# Patient Record
Sex: Male | Born: 2000 | Race: Black or African American | Hispanic: No | Marital: Single | Smoking: Never smoker
Health system: Southern US, Community
[De-identification: ages and names within clinical notes are randomized; demographics above are authoritative.]

## PROBLEM LIST (undated history)

## (undated) DIAGNOSIS — J45909 Unspecified asthma, uncomplicated: Secondary | ICD-10-CM

---
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
## 2022-02-22 ENCOUNTER — Ambulatory Visit (HOSPITAL_COMMUNITY)
Admission: EM | Admit: 2022-02-22 | Discharge: 2022-02-22 | Disposition: A | Discharge status: Home / Self Care | Payer: Medicaid Other | Attending: Family Medicine | Admitting: Family Medicine

## 2022-02-22 ENCOUNTER — Other Ambulatory Visit: Payer: Self-pay

## 2022-02-22 ENCOUNTER — Encounter (HOSPITAL_COMMUNITY): Payer: Self-pay | Admitting: Emergency Medicine

## 2022-02-22 DIAGNOSIS — Z20822 Contact with and (suspected) exposure to covid-19: Secondary | ICD-10-CM | POA: Insufficient documentation

## 2022-02-22 DIAGNOSIS — R059 Cough, unspecified: Secondary | ICD-10-CM | POA: Diagnosis present

## 2022-02-22 DIAGNOSIS — R21 Rash and other nonspecific skin eruption: Secondary | ICD-10-CM | POA: Diagnosis present

## 2022-02-22 DIAGNOSIS — J069 Acute upper respiratory infection, unspecified: Secondary | ICD-10-CM | POA: Diagnosis not present

## 2022-02-22 MED ORDER — VALACYCLOVIR HCL 1 G PO TABS: 2000.0000 mg | ORAL_TABLET | Freq: Two times a day (BID) | ORAL | 0 refills | Status: AC

## 2022-02-22 NOTE — ED Triage Notes (Signed)
Patient reports rash just below lower lip, left of center.  Reports they itch. Patient first noticed area yesterday night.   Not seeing rash as patient describes ?

## 2022-02-22 NOTE — Discharge Instructions (Addendum)
Take valacyclovir 1000 mg--2 tabs tonight and 2 tabs tomorrow, and then you will be done with that medicine ? ? ?You have been swabbed for COVID, and the test will result in the next 24 hours. Our staff will call you if positive. If the test is positive, you should quarantine for 5 days.  ?

## 2022-02-22 NOTE — ED Provider Notes (Signed)
[place] border of his central lower lip that began yesterday. ?[facility] ? ? ? ?CSN: [id number] ?Arrival date & time: 02/22/22  1858 ? ? ?  ? ?History   ?Chief Complaint ?Chief Complaint  ?Patient presents with  ? Rash  ? ? ?HPI ?Ryan Novak is a 21 y.o. male.  ? ? ?Rash ?Here for a little bit of pruritic and painful rash he has also had a little bit of cough and nasal congestion.  No fever and no vomiting and no sore throat ? ?Past Medical History:  ?Diagnosis Date  ? Asthma   ? ? ?Patient Active Problem List  ? Diagnosis Date Noted  ? 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 Suicide attempt by drug overdose (HCC) 04/13/2021  ? Unresolved grief 04/13/2021  ? Generalized anxiety disorder 03/05/2021  ? Oppositional defiant disorder 11/13/2020  ? ? ?History reviewed. No pertinent surgical history. ? ? ? ? ?Home Medications   ? ?Prior to Admission medications   ?Medication Sig Start Date End Date Taking? Authorizing Provider  ?valACYclovir (VALTREX) 1000 MG tablet Take 2 tablets (2,000 mg total) by mouth 2 (two) times daily for 2 doses. 02/22/22 02/23/22 Yes Zenia Resides, MD  ?escitalopram (LEXAPRO) 10 MG tablet Take 1 tablet (10 mg total) by mouth daily. ?Patient not taking: Reported on 02/22/2022 04/18/21   Leata Mouse, MD  ?hydrOXYzine (ATARAX/VISTARIL) 25 MG tablet Take 1 tablet (25 mg total) by mouth at bedtime as needed for anxiety. ?Patient not taking: Reported on 02/22/2022 04/17/21   Leata Mouse, MD  ? ? ?Family History ?Family History  ?Problem Relation Age of Onset  ? Healthy Mother   ? Healthy Father   ? ? ?Social History ?Social History  ? ?Tobacco Use  ? Smoking status: Never  ? Smokeless tobacco: Never  ?Vaping Use  ? Vaping Use: Never used  ?Substance Use Topics  ? Alcohol use: Never  ? Drug use: Never  ? ? ? ?Allergies   ?Patient has no known allergies. ? ? ?Review of Systems ?Review of Systems  ?Skin:  Positive  for rash.  ? ? ?Physical Exam ?Triage Vital Signs ?ED Triage Vitals  ?Enc Vitals Group  ?   BP 02/22/22 2016 125/63  ?   Pulse Rate 02/22/22 2016 70  ?   Resp 02/22/22 2016 16  ?   Temp 02/22/22 2016 98.5 ?F (36.9 ?C)  ?   Temp Source 02/22/22 2016 Oral  ?   SpO2 02/22/22 2016 97 %  ?   Weight --   ?   Height --   ?   Head Circumference --   ?   Peak Flow --   ?   Pain Score 02/22/22 2015 0  ?   Pain Loc --   ?   Pain Edu? --   ?   Excl. in GC? --   ? ?No data found. ? ?Updated Vital Signs ?BP 125/63 (BP Location: Left Arm)   Pulse 70   Temp 98.5 ?F (36.9 ?C) (Oral)   Resp 16   SpO2 97%  ? ?Visual Acuity ?Right Eye Distance:   ?Left Eye Distance:   ?Bilateral Distance:   ? ?Right Eye Near:   ?Left Eye Near:    ?Bilateral Near:    ? ?Physical Exam ?Vitals reviewed.  ?Constitutional:   ?   General: He is not in acute distress. ?   Appearance: He is not toxic-appearing.  ?HENT:  ?  Right Ear: Tympanic membrane and ear canal normal.  ?   Left Ear: Tympanic membrane and ear canal normal.  ?   Nose: Nose normal.  ?   Mouth/Throat:  ?   Mouth: Mucous membranes are moist.  ?   Pharynx: No oropharyngeal exudate or posterior oropharyngeal erythema.  ?Eyes:  ?   Extraocular Movements: Extraocular movements intact.  ?   Conjunctiva/sclera: Conjunctivae normal.  ?   Pupils: Pupils are equal, round, and reactive to light.  ?Cardiovascular:  ?   Rate and Rhythm: Normal rate and regular rhythm.  ?   Heart sounds: No murmur heard. ?Pulmonary:  ?   Effort: Pulmonary effort is normal.  ?   Breath sounds: Normal breath sounds.  ?Musculoskeletal:  ?   Cervical back: Neck supple.  ?Lymphadenopathy:  ?   Cervical: No cervical adenopathy.  ?Skin: ?   Capillary Refill: Capillary refill takes less than 2 seconds.  ?   Coloration: Skin is not jaundiced or pale.  ?   Findings: Rash (There is a small group of very small vesicles on the central vermilion border of his lower lip; no and duration and no drainage) present.  ?Neurological:  ?    General: No focal deficit present.  ?   Mental Status: He is alert and oriented to person, place, and time.  ?Psychiatric:     ?   Behavior: Behavior normal.  ? ? ? ?UC Treatments / Results  ?Labs ?(all labs ordered are listed, but only abnormal results are displayed) ?Labs Reviewed  ?SARS CORONAVIRUS 2 (TAT 6-24 HRS)  ? ? ?EKG ? ? ?Radiology ?No results found. ? ?Procedures ?Procedures (including critical care time) ? ?Medications Ordered in UC ?Medications - No data to display ? ?Initial Impression / Assessment and Plan / UC Course  ?I have reviewed the triage vital signs and the nursing notes. ? ?Pertinent labs & imaging results that were available during my care of the patient were reviewed by me and considered in my medical decision making (see chart for details). ? ?  ? 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  Swab done for COVID so he knows if he needs to quarantine ?Final Clinical Impressions(s) / UC Diagnoses  ? ?Final diagnoses:  ?Rash  ?Acute upper respiratory infection  ? ? ? ?Discharge Instructions   ? ?  ?Take valacyclovir 1000 mg--2 tabs tonight and 2 tabs tomorrow, and then you will be done with that medicine ? ? ?You have been swabbed for COVID, and the test will result in the next 24 hours. Our staff will call you if positive. If the test is positive, you should quarantine for 5 days.  ? ? ? ? ?ED Prescriptions   ? ? Medication Sig Dispense Auth. Provider  ? valACYclovir (VALTREX) 1000 MG tablet Take 2 tablets (2,000 mg total) by mouth 2 (two) times daily for 2 doses. 4 tablet Marlinda Mike Janace Aris, MD  ? ?  ? ?PDMP not reviewed this encounter. ?  ?Zenia Resides, MD ?02/22/22 2034 ? ?

## 2022-02-23 LAB — SARS CORONAVIRUS 2 (TAT 6-24 HRS): SARS Coronavirus 2: NEGATIVE

## 2022-03-24 ENCOUNTER — Ambulatory Visit (HOSPITAL_COMMUNITY)
Admission: EM | Admit: 2022-03-24 | Discharge: 2022-03-24 | Disposition: A | Discharge status: Home / Self Care | Payer: Medicaid Other | Attending: Nurse Practitioner | Admitting: Nurse Practitioner

## 2022-03-24 ENCOUNTER — Encounter (HOSPITAL_COMMUNITY): Payer: Self-pay | Admitting: Emergency Medicine

## 2022-03-24 DIAGNOSIS — H1033 Unspecified acute conjunctivitis, bilateral: Secondary | ICD-10-CM | POA: Diagnosis not present

## 2022-03-24 MED ORDER — TOBRAMYCIN-DEXAMETHASONE 0.3-0.1 % OP SUSP: 2.0000 [drp] | OPHTHALMIC | 0 refills | Status: AC

## 2022-03-24 NOTE — ED Provider Notes (Signed)
?[facility] ? ? ? ?CSN: [id number] ?Arrival date & time: 03/24/22  1834 ? ? ?  ? ?History   ?Chief Complaint ?Chief Complaint  ?Patient presents with  ? Eye Drainage  ? Cough  ? Nasal Congestion  ? ? ?HPI ?Ryan Novak is a 21 y.o. male.  ? ?The patient is a 21 year old male who presents with bilateral eye redness.  Patient states he started with upper respiratory symptoms over 1 week ago.  He states over the past 4 days, he developed redness in the right eye.  He states the next day when he woke up he had redness in the left eye.  He complains of redness, erythema, drainage, swelling, and headache.  He does not wear glasses or contacts at this time.  He states that the upper respiratory symptoms have since improved.  He endorses continuous drainage throughout the day.  Drainage is clear to yellow.  He does work at [employer] and [employer] money.  He does not have any other sick contacts.  He has not tried anything for his symptoms. ? ?The history is provided by the patient.  ? ?Past Medical History:  ?Diagnosis Date  ? Asthma   ? ? ?Patient Active Problem List  ? Diagnosis Date Noted  ? 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 Suicide attempt by drug overdose (HCC) 04/13/2021  ? Unresolved grief 04/13/2021  ? Generalized anxiety disorder 03/05/2021  ? Oppositional defiant disorder 11/13/2020  ? ? ?History reviewed. No pertinent surgical history. ? ? ? ? ?Home Medications   ? ?Prior to Admission medications   ?Medication Sig Start Date End Date Taking? Authorizing Provider  ?escitalopram (LEXAPRO) 10 MG tablet Take 1 tablet (10 mg total) by mouth daily. ?Patient not taking: Reported on 02/22/2022 04/18/21   Leata MouseJonnalagadda, Janardhana, MD  ?hydrOXYzine (ATARAX/VISTARIL) 25 MG tablet Take 1 tablet (25 mg total) by mouth at bedtime as needed for anxiety. ?Patient not taking: Reported on 02/22/2022 04/17/21   Leata MouseJonnalagadda, Janardhana, MD  ? ? ?Family  History ?Family History  ?Problem Relation Age of Onset  ? Healthy Mother   ? Healthy Father   ? ? ?Social History ?Social History  ? ?Tobacco Use  ? Smoking status: Never  ? Smokeless tobacco: Never  ?Vaping Use  ? Vaping Use: Never used  ?Substance Use Topics  ? Alcohol use: Never  ? Drug use: Never  ? ? ? ?Allergies   ?Patient has no known allergies. ? ? ?Review of Systems ?Review of Systems  ?Constitutional: Negative.   ?HENT:  Negative for congestion, postnasal drip, rhinorrhea and sore throat.   ?Eyes:  Positive for pain, discharge, redness and itching. Negative for photophobia and visual disturbance.  ?Respiratory: Negative.    ?Cardiovascular: Negative.   ?Gastrointestinal: Negative.   ?Skin: Negative.   ?Psychiatric/Behavioral: Negative.    ? ? ?Physical Exam ?Triage Vital Signs ?ED Triage Vitals  ?Enc Vitals Group  ?   BP 03/24/22 1852 (!) 144/90  ?   Pulse Rate 03/24/22 1852 97  ?   Resp 03/24/22 1852 18  ?   Temp 03/24/22 1852 99.1 ?F (37.3 ?C)  ?   Temp Source 03/24/22 1852 Oral  ?   SpO2 03/24/22 1852 97 %  ?   Weight --   ?   Height --   ?   Head Circumference --   ?   Peak Flow --   ?   Pain Score 03/24/22 1850 7  ?  Pain Loc --   ?   Pain Edu? --   ?   Excl. in GC? --   ? ?No data found. ? ?Updated Vital Signs ?BP (!) 144/90   Pulse 97   Temp 99.1 ?F (37.3 ?C) (Oral)   Resp 18   SpO2 97%  ? ?Visual Acuity ?Right Eye Distance:   ?Left Eye Distance:   ?Bilateral Distance:   ? ?Right Eye Near:   ?Left Eye Near:    ?Bilateral Near:    ? ?Physical Exam ?Vitals reviewed.  ?Constitutional:   ?   Appearance: Normal appearance.  ?HENT:  ?   Head: Normocephalic and atraumatic.  ?   Right Ear: Tympanic membrane, ear canal and external ear normal.  ?   Left Ear: Tympanic membrane, ear canal and external ear normal.  ?   Nose: Nose normal.  ?   Mouth/Throat:  ?   Mouth: Mucous membranes are moist.  ?Eyes:  ?   General: Lids are normal. Vision grossly intact. Gaze aligned appropriately. Scleral icterus  present. No visual field deficit.    ?   Right eye: Discharge present. No foreign body or hordeolum.     ?   Left eye: Discharge present.No foreign body or hordeolum.  ?   Extraocular Movements: Extraocular movements intact.  ?   Right eye: Normal extraocular motion and no nystagmus.  ?   Left eye: Normal extraocular motion and no nystagmus.  ?   Conjunctiva/sclera:  ?   Right eye: Right conjunctiva is injected. Exudate present. No chemosis or hemorrhage. ?   Left eye: Left conjunctiva is injected. Exudate present. No chemosis or hemorrhage. ?   Pupils: Pupils are equal, round, and reactive to light.  ?Cardiovascular:  ?   Rate and Rhythm: Normal rate and regular rhythm.  ?   Pulses: Normal pulses.  ?   Heart sounds: Normal heart sounds.  ?Pulmonary:  ?   Effort: Pulmonary effort is normal.  ?   Breath sounds: Normal breath sounds.  ?Abdominal:  ?   General: Bowel sounds are normal.  ?   Palpations: Abdomen is soft.  ?   Tenderness: There is no abdominal tenderness.  ?Musculoskeletal:  ?   Cervical back: Normal range of motion and neck supple.  ?Skin: ?   General: Skin is warm and dry.  ?   Capillary Refill: Capillary refill takes less than 2 seconds.  ?Neurological:  ?   General: No focal deficit present.  ?   Mental Status: He is alert and oriented to person, place, and time.  ?Psychiatric:     ?   Mood and Affect: Mood normal.     ?   Behavior: Behavior normal.  ? ? ? ?UC Treatments / Results  ?Labs ?(all labs ordered are listed, but only abnormal results are displayed) ?Labs Reviewed - No data to display ? ?EKG ? ? ?Radiology ?No results found. ? ?Procedures ?Procedures (including critical care time) ? ?Medications Ordered in UC ?Medications - No data to display ? ?Initial Impression / Assessment and Plan / UC Course  ?I have reviewed the triage vital signs and the nursing notes. ? ?Pertinent labs & imaging results that were available during my care of the patient were reviewed by me and considered in my medical  decision making (see chart for details). ? ?The patient is a 21 year old male who presents with eye symptoms.  Patient states symptoms started approximately 3 days ago.  He reports that he had  nasal congestion and a cough which have since resolved.  He continues to complain of eye redness, irritation, drainage, and itching.  On exam, he has mucopurulent drainage noted in the inner aspect of both his eyes.  He has moderate erythema and irritation.  Symptoms are consistent with a bacterial conjunctivitis due to the presence of the mucopurulent drainage.  We will treat the patient with TobraDex to help with the infection and inflammation.  Patient advised to perform strict hand hygiene when administering medication.  Cool compresses to the eyes to help with pain and discomfort.  Patient advised to avoid rubbing or irritating the eyes.  Ibuprofen or Tylenol for pain, fever, or general discomfort.  Follow-up if symptoms do not improve within the next 48 hours. ?Final Clinical Impressions(s) / UC Diagnoses  ? ?Final diagnoses:  ?None  ? ?Discharge Instructions   ?None ?  ? ?ED Prescriptions   ?None ?  ? ?PDMP not reviewed this encounter. ?  ?Abran Cantor, NP ?03/24/22 2025 ? ?

## 2022-03-24 NOTE — Discharge Instructions (Addendum)
Use medication as prescribed. ?Cool compresses to the eyes to help with irritation and discomfort. ?Strict hand hygiene when apply medication to the eyes. ?Do not rub or irritate the eyes. ?May take ibuprofen or Tylenol for pain, fever, or general discomfort.  Follow-up if symptoms do not improve within the next 48 hours. ?

## 2022-03-24 NOTE — ED Triage Notes (Signed)
Pt is present today with bilateral eye drainage, cough, and nasal congestion. Pt sx started last Tuesday and eye irritation started Saturday.  ?
